# Patient Record
Sex: Male | Born: 1990 | Race: Black or African American | Hispanic: No | Marital: Single | Smoking: Never smoker
Health system: Southern US, Community
[De-identification: ages and names within clinical notes are randomized; demographics above are authoritative.]

## PROBLEM LIST (undated history)

## (undated) DIAGNOSIS — R011 Cardiac murmur, unspecified: Secondary | ICD-10-CM

## (undated) DIAGNOSIS — T7840XA Allergy, unspecified, initial encounter: Secondary | ICD-10-CM

## (undated) DIAGNOSIS — J45909 Unspecified asthma, uncomplicated: Secondary | ICD-10-CM

## (undated) HISTORY — DX: Cardiac murmur, unspecified: R01.1

## (undated) HISTORY — DX: Unspecified asthma, uncomplicated: J45.909

## (undated) HISTORY — DX: Allergy, unspecified, initial encounter: T78.40XA

## (undated) HISTORY — PX: TONSILLECTOMY: SUR1361

---
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
## 2011-06-01 ENCOUNTER — Encounter: Payer: Self-pay | Admitting: Family Medicine

## 2011-06-01 ENCOUNTER — Ambulatory Visit (INDEPENDENT_AMBULATORY_CARE_PROVIDER_SITE_OTHER): Payer: Federal, State, Local not specified - PPO | Admitting: Family Medicine

## 2011-06-01 DIAGNOSIS — J301 Allergic rhinitis due to pollen: Secondary | ICD-10-CM | POA: Insufficient documentation

## 2011-06-01 DIAGNOSIS — Q210 Ventricular septal defect: Secondary | ICD-10-CM

## 2011-06-01 DIAGNOSIS — Q254 Congenital malformation of aorta unspecified: Secondary | ICD-10-CM

## 2011-06-01 DIAGNOSIS — L509 Urticaria, unspecified: Secondary | ICD-10-CM

## 2011-06-01 DIAGNOSIS — Z0000 Encounter for general adult medical examination without abnormal findings: Secondary | ICD-10-CM

## 2011-06-01 DIAGNOSIS — R030 Elevated blood-pressure reading, without diagnosis of hypertension: Secondary | ICD-10-CM

## 2011-06-01 LAB — LIPID PANEL
Cholesterol: 202 mg/dL — ABNORMAL HIGH (ref 0–200)
HDL: 63 mg/dL (ref >39)
Total CHOL/HDL Ratio: 3.2 Ratio
Triglycerides: 42 mg/dL (ref <150)
VLDL: 8 mg/dL (ref 0–40)

## 2011-06-01 NOTE — Progress Notes (Signed)
Subjective:    Patient ID: Maurice Miller, male    DOB: 1991/08/21, 20 y.o.   MRN: [id number]  HPI He is here for complete exam. On Monday evening he developed a rash. Wednesday morning he went to an urgent care Center and was given steroid injection and a steroid taper the dose pack. He states he is feeling better. 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 He does have springtime allergies. He also has a history of VSD and has been followed by pediatric cardiology in [facility]. He will need referral to an adult cardiologist. Apparently the VSD has cause very little trouble. He did play football in high school and for a brief period in college. He has no other concerns or complaints. His social and family history were reviewed.  Review of Systems  Constitutional: Negative.   HENT: Negative.   Eyes: Negative.   Respiratory: Negative.   Cardiovascular: Negative.   Gastrointestinal: Negative.   Genitourinary: Negative.   Musculoskeletal: Negative.   Skin: Negative.   Neurological: Negative.        Objective:   Physical Exam BP 140/90  Pulse 74  Ht 5\' 11"  (1.803 m)  Wt 195 lb (88.451 kg)  BMI 27.20 kg/m2  General Appearance:    Alert, cooperative, no distress, appears stated age  Head:    Normocephalic, without obvious abnormality, atraumatic  Eyes:    PERRL, conjunctiva/corneas clear, EOM's intact, fundi    benign  Ears:    Normal TM's and external ear canals  Nose:   Nares normal, mucosa normal, no drainage or sinus   tenderness  Throat:   Lips, mucosa, and tongue normal; teeth and gums normal  Neck:   Supple, no lymphadenopathy;  thyroid:  no   enlargement/tenderness/nodules; no carotid   bruit or JVD  Back:    Spine nontender, no curvature, ROM normal, no CVA     tenderness  Lungs:     Clear to auscultation bilaterally without wheezes, rales or     ronchi; respirations unlabored  Chest Wall:    No tenderness or deformity   Heart:    Regular rate and rhythm, S1 and S2 normal, 3/6  SEM no murmur, rub   or gallop  Breast Exam:    No chest wall tenderness, masses or gynecomastia  Abdomen:     Soft, non-tender, nondistended, normoactive bowel sounds,    no masses, no hepatosplenomegaly  Genitalia:    Normal male external genitalia without lesions.  Testicles without masses.  No inguinal hernias.  Rectal:   Deferred due to age <40 and lack of symptoms  Extremities:   No clubbing, cyanosis or edema  Pulses:   2+ and symmetric all extremities  Skin:   Skin color, texture, turgor normal, slight erythema is noted with dermatographia is him   Lymph nodes:   Cervical, supraclavicular, and axillary nodes normal  Neurologic:   CNII-XII intact, normal strength, sensation and gait; reflexes 2+ and symmetric throughout          Psych:   Normal mood, affect, hygiene and grooming.           Assessment & Plan:   1. VSD (ventricular septal defect and aortic arch hypoplasia  Ambulatory referral to Cardiology  2. Routine general medical examination at a health care facility  Lipid panel  3. Allergic rhinitis due to pollen    4. Urticaria    5. Elevated blood pressure (not hypertension)     He is to return here in  2 months for recheck on his blood pressure.

## 2011-06-01 NOTE — Patient Instructions (Signed)
Continue to exercise. Cut back on salt. Come back here in 2 months for blood pressure recheck.

## 2011-06-02 ENCOUNTER — Telehealth: Payer: Self-pay

## 2011-06-02 NOTE — Telephone Encounter (Signed)
Called and left message about labs and sent diet info

## 2011-06-19 ENCOUNTER — Telehealth: Payer: Self-pay | Admitting: Family Medicine

## 2011-06-26 NOTE — Telephone Encounter (Signed)
JCL

## 2011-08-01 ENCOUNTER — Ambulatory Visit: Payer: Federal, State, Local not specified - PPO | Admitting: Family Medicine

## 2012-01-12 ENCOUNTER — Ambulatory Visit: Payer: Federal, State, Local not specified - PPO | Admitting: Family Medicine

## 2015-02-10 ENCOUNTER — Ambulatory Visit (INDEPENDENT_AMBULATORY_CARE_PROVIDER_SITE_OTHER): Payer: Federal, State, Local not specified - PPO | Admitting: Family Medicine

## 2015-02-10 ENCOUNTER — Encounter: Payer: Self-pay | Admitting: Family Medicine

## 2015-02-10 VITALS — BP 130/100 | HR 70 | Wt 203.0 lb

## 2015-02-10 DIAGNOSIS — Q254 Other congenital malformations of aorta: Secondary | ICD-10-CM

## 2015-02-10 DIAGNOSIS — Q210 Ventricular septal defect: Secondary | ICD-10-CM

## 2015-02-10 DIAGNOSIS — Q2542 Hypoplasia of aorta: Principal | ICD-10-CM

## 2015-02-10 NOTE — Progress Notes (Signed)
   Subjective:    Patient ID: Maurice KeensKevin L Fetch Miller, male    DOB: 1991-06-29, 24 y.o.   MRN: [id number]  HPI He is here for consultation. He has a history of VSD and in the past and been [facility] [place] [facility] [place]. He has outgrown the pediatric cardiology program. He has not been seen in approximately 5 years. He has had no chest pain, shortness of breath. He has had some issues with palpitations started roughly a month ago. It lasts just for for 5 seconds. The occur several times per week. He also states he has some shortness of breath with this but he has not actually measured his heart rate.   Review of Systems     Objective:   Physical Exam Alert and in no distress. 4/6 murmur is noted in the left chest area. Lungs are clear to auscultation. EKG shows no acute changes.       Assessment & Plan:  VSD (ventricular septal defect and aortic arch hypoplasia - Plan: Ambulatory referral to Cardiology Recommend he keep track of his pulse rate does have these palpitations to see if there are any rhythm changes. Explained that the palpitations don't necessarily sound like they're related to the  VSD

## 2015-02-17 ENCOUNTER — Encounter: Payer: Self-pay | Admitting: Family Medicine

## 2015-03-31 ENCOUNTER — Ambulatory Visit (INDEPENDENT_AMBULATORY_CARE_PROVIDER_SITE_OTHER): Payer: Federal, State, Local not specified - PPO | Admitting: Cardiovascular Disease

## 2015-03-31 ENCOUNTER — Encounter: Payer: Self-pay | Admitting: Cardiovascular Disease

## 2015-03-31 VITALS — BP 126/86 | HR 69 | Ht 70.0 in | Wt 191.5 lb

## 2015-03-31 DIAGNOSIS — Q210 Ventricular septal defect: Secondary | ICD-10-CM | POA: Diagnosis not present

## 2015-03-31 DIAGNOSIS — Q254 Other congenital malformations of aorta: Secondary | ICD-10-CM | POA: Diagnosis not present

## 2015-03-31 DIAGNOSIS — Q2542 Hypoplasia of aorta: Principal | ICD-10-CM

## 2015-03-31 NOTE — Patient Instructions (Signed)
Your physician has requested that you have an echocardiogram with bubble study. Echocardiography is a painless test that uses sound waves to create images of your heart. It provides your doctor with information about the size and shape of your heart and how well your heart's chambers and valves are working. This procedure takes approximately one hour. There are no restrictions for this procedure.  Dr Allyson SabalBerry recommends that you schedule a follow-up appointment in 2 years. You will receive a reminder letter in the mail two months in advance. If you don't receive a letter, please call our office to schedule the follow-up appointment.

## 2015-03-31 NOTE — Assessment & Plan Note (Signed)
Maurice Miller is a 24 year old healthy muscular appearing single African-American male with a history of VSD diagnosed at birth. He's been followed on an every other year basis by a pediatric cardiologist. He was told that this was near the aortic valve making membranous and small. He has had imaging studies in the past. He is totally asymptomatic. I'm going to get a 2-D echo with bubble study I will see back in 2 years

## 2015-03-31 NOTE — Progress Notes (Signed)
     03/31/2015 Maurice FullerKevin L Sterling Miller   1991/05/05  [id number]  Primary Physician Carollee HerterLALONDE,JOHN CHARLES, MD Primary Cardiologist: Runell GessJonathan J. Markelle Asaro MD Roseanne RenoFACP,FACC,FAHA, FSCAI   HPI:  Maurice Miller is a delightful 24 year old fit and muscular appearing single African-American male who is a senior at [employer] . He is prelaw. He is accompanied by his mother today. He was referred by his primary care physician, Dr. Sharlot GowdaJohn LaLonde, for cardiovascular evaluation and to be established in the adult cardiology practice. 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 By description it sounds membranous and small. The aortic valve. He has been followed by 2-D echocardiogram to the past. He is completely a symptomatic. He did complain of some palpitations several months ago but these are infrequent since her last only seconds at a time. He specifically denies chest pain or shortness of breath.   No current outpatient prescriptions on file.   No current facility-administered medications for this visit.    Allergies  Allergen Reactions  . Biaxin [Clarithromycin] Hives    History   Social History  . Marital Status: Single    Spouse Name: N/A  . Number of Children: N/A  . Years of Education: N/A   Occupational History  . Not on file.   Social History Main Topics  . Smoking status: Never Smoker   . Smokeless tobacco: Never Used  . Alcohol Use: No  . Drug Use: No  . Sexual Activity: Yes    Birth Control/ Protection: Condom   Other Topics Concern  . Not on file   Social History Narrative     Review of Systems: General: negative for chills, fever, night sweats or weight changes.  Cardiovascular: negative for chest pain, dyspnea on exertion, edema, orthopnea, palpitations, paroxysmal nocturnal dyspnea or shortness of breath Dermatological: negative for rash Respiratory: negative for cough or wheezing Urologic: negative for hematuria Abdominal: negative for nausea,  vomiting, diarrhea, bright red blood per rectum, melena, or hematemesis Neurologic: negative for visual changes, syncope, or dizziness All other systems reviewed and are otherwise negative except as noted above.    Blood pressure 126/86, pulse 69, height 5\' 10"  (1.778 m), weight 191 lb 8 oz (86.864 kg).  General appearance: alert and no distress Neck: no adenopathy, no carotid bruit, no JVD, supple, symmetrical, trachea midline and thyroid not enlarged, symmetric, no tenderness/mass/nodules Lungs: clear to auscultation bilaterally Heart: loud continuous murmur at the left lower sternal border Extremities: extremities normal, atraumatic, no cyanosis or edema  EKG normal sinus rhythm at 69 without ST or T-wave changes. 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 He's been followed on an every other year basis by a pediatric cardiologist. He was told that this was near the aortic valve making membranous and small. He has had imaging studies in the past. He is totally asymptomatic. I'm going to get a 2-D echo with bubble study I will see back in 2 years      Runell GessJonathan J. Lonni Dirden MD [facility] CampusFACP,FACC,FAHA, [facility] 03/31/2015 1:59 PM

## 2015-04-05 ENCOUNTER — Ambulatory Visit (HOSPITAL_COMMUNITY)
Admission: RE | Admit: 2015-04-05 | Discharge: 2015-04-05 | Disposition: A | Discharge status: Home / Self Care | Payer: Federal, State, Local not specified - PPO | Source: Ambulatory Visit | Attending: Cardiovascular Disease | Admitting: Cardiovascular Disease

## 2015-04-05 DIAGNOSIS — I340 Nonrheumatic mitral (valve) insufficiency: Secondary | ICD-10-CM | POA: Insufficient documentation

## 2015-04-05 DIAGNOSIS — Q210 Ventricular septal defect: Secondary | ICD-10-CM | POA: Diagnosis present

## 2015-04-05 DIAGNOSIS — Q254 Other congenital malformations of aorta: Secondary | ICD-10-CM | POA: Diagnosis not present

## 2015-04-05 DIAGNOSIS — I071 Rheumatic tricuspid insufficiency: Secondary | ICD-10-CM | POA: Diagnosis not present

## 2015-04-05 DIAGNOSIS — Q2542 Hypoplasia of aorta: Secondary | ICD-10-CM

## 2015-10-07 ENCOUNTER — Encounter: Payer: Self-pay | Admitting: Medical

## 2015-10-07 ENCOUNTER — Ambulatory Visit (INDEPENDENT_AMBULATORY_CARE_PROVIDER_SITE_OTHER): Payer: Federal, State, Local not specified - PPO | Admitting: Medical

## 2015-10-07 ENCOUNTER — Ambulatory Visit
Admission: RE | Admit: 2015-10-07 | Discharge: 2015-10-07 | Disposition: A | Discharge status: Home / Self Care | Payer: Federal, State, Local not specified - PPO | Source: Ambulatory Visit | Attending: Medical | Admitting: Medical

## 2015-10-07 VITALS — BP 104/80 | HR 73 | Wt 198.0 lb

## 2015-10-07 DIAGNOSIS — N509 Disorder of male genital organs, unspecified: Secondary | ICD-10-CM

## 2015-10-07 DIAGNOSIS — N5089 Other specified disorders of the male genital organs: Secondary | ICD-10-CM

## 2015-10-07 NOTE — Progress Notes (Signed)
Subjective: Chief Complaint  Patient presents with  . lower abdominal pain    felt nodule on one testicle and it was extremely tender. does workout daily but can not recall hitting area or pulling anything in that area   Here for lower abdominal pina.  2 days ago noticed tender area in testicle, felt unusual in left lower abdomen.  Then next day felt a little swollen on side of left testicle and felt swollen in left lower abdomen.   Denies fever, no NVD.   No penile discharge, no dysuria no urinary frequency, no urinary urgency, no odor in urine, no blood in urine.   Has some back pain but not related, he attributes this to labor on the job and exercise.   Sexually active, doesn't use condoms, been with same partner x 4 years.   Has had STD screen prior 16mo ago, negative, done at [employer].  No prior similar.  No other aggravating or relieving factors. No other complaint.  Past Medical History  Diagnosis Date  . Allergy   . Heart murmur     history of small, asymptomatic membranous VSD   Family History  Problem Relation Age of Onset  . Hypertension Father   . Asthma Sister   . Alzheimer's disease Maternal Grandmother   . Cancer Maternal Grandfather     Prostate cancer  . Hypertension Paternal Grandmother     ROS as in subjective   Objective: BP 104/80 mmHg  Pulse 73  Wt 198 lb (89.812 kg)  Gen: wd, wn, nad Skin: no erythema or bruising Left testicle dense feeling with asymmetry, significantly larger than the normal soften appearing right testicle, no other mass, no spermatic cord tenderness or swelling, no hernia, no lymphadenopathy Abdomen: +bs, soft, non tender, no mass, on organomegaly   Assessment: Encounter Diagnoses  Name Primary?  . Testicular mass Yes  . Scrotal swelling     Plan: Discussed case with supervising physician Dr. Susann GivensLalonde who also examined him.  Referral for scrotal ultrasound and likely referral to Urology.

## 2015-10-08 ENCOUNTER — Telehealth: Payer: Self-pay

## 2015-10-08 DIAGNOSIS — N5089 Other specified disorders of the male genital organs: Secondary | ICD-10-CM

## 2015-10-08 NOTE — Telephone Encounter (Signed)
Referred to [facility]

## 2015-10-14 ENCOUNTER — Telehealth: Payer: Self-pay | Admitting: Family Medicine

## 2015-10-14 NOTE — Telephone Encounter (Signed)
pts mom is aware and will get [place] a copy of the second opinion as well

## 2015-10-14 NOTE — Telephone Encounter (Signed)
Pt's mom, Velna Hatchet, called for pt to request a referral to urologist, Dr Seymour Bars with [facility], in [place] near their home for a 2nd opinion. Pt has been seen by one urologist. 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 Call Velna Hatchet back @ [phone] since it will be difficult to reach pt.

## 2015-10-14 NOTE — Telephone Encounter (Signed)
Faxed all forms to the office for second opinion. [facility] appt was yesterday at [facility] and the doctor has not signed off on those orders yet. LMTCB on pts mothers voicemail

## 2015-10-14 NOTE — Telephone Encounter (Signed)
What did urology here recommend?  I haven't gotten the note?    Ok for referral.

## 2015-10-18 ENCOUNTER — Telehealth: Payer: Self-pay | Admitting: Medical

## 2015-10-18 NOTE — Telephone Encounter (Signed)
That is fine, so write note

## 2015-10-18 NOTE — Telephone Encounter (Signed)
Pt said he was not able to go to work on 1/15 & 1/16 due to the scrotum mass pain that he was seen for on 1/12 and his job is requesting a doctor note to approve him being out of work.

## 2015-10-19 NOTE — Telephone Encounter (Signed)
Called pt to advise that note is ready for pick up.  

## 2015-10-26 ENCOUNTER — Ambulatory Visit (INDEPENDENT_AMBULATORY_CARE_PROVIDER_SITE_OTHER): Payer: Federal, State, Local not specified - PPO | Admitting: Family Medicine

## 2015-10-26 VITALS — BP 112/80 | HR 78 | Temp 98.4°F | Resp 18 | Ht 70.0 in | Wt 202.6 lb

## 2015-10-26 DIAGNOSIS — J069 Acute upper respiratory infection, unspecified: Secondary | ICD-10-CM | POA: Diagnosis not present

## 2015-10-26 DIAGNOSIS — R52 Pain, unspecified: Secondary | ICD-10-CM | POA: Diagnosis not present

## 2015-10-26 DIAGNOSIS — J4521 Mild intermittent asthma with (acute) exacerbation: Secondary | ICD-10-CM

## 2015-10-26 MED ORDER — ALBUTEROL SULFATE HFA 108 (90 BASE) MCG/ACT IN AERS: 1.0000 | INHALATION_SPRAY | RESPIRATORY_TRACT | Status: AC | PRN

## 2015-10-26 MED ORDER — ALBUTEROL SULFATE (2.5 MG/3ML) 0.083% IN NEBU
2.5000 mg | INHALATION_SOLUTION | Freq: Once | RESPIRATORY_TRACT | Status: AC
  Administered 2015-10-26: 2.5 mg via RESPIRATORY_TRACT

## 2015-10-26 NOTE — Progress Notes (Signed)
Subjective:  By signing my name below, I, [name], attest that this documentation has been prepared under the direction and in the presence of Meredith Staggers, MD.  [name], Medical Scribe. 10/26/2015.  4:33 PM.  I personally performed the services described in this documentation, which was scribed in my presence. The recorded information has been reviewed and considered, and addended by me as needed.    Patient ID: Maurice Miller, male    DOB: March 26, 1991, 24 y.o.   MRN: [id number]  Chief Complaint  Patient presents with  . Shortness of Breath    x last night  . Wheezing    x last night  . Generalized Body Aches    x sunday  . Cough    x sunday  . Sore Throat    x sunday  . some congestion    x sunday    HPI HPI Comments: Maurice Miller is a 25 y.o. male who presents to [facility] complaining of flu-like ymptoms, gradual onset 2 days ago.  Pt reports that his symptoms started with fatigue and myalgia after he woke up in the morning, that then developed into shortness of breath, hot/cold symptoms, sore throat, cough, congestion, headache, rhinorrhea and wheezing. 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 Pt is not UTD with the flu vaccine. He reports that he is able to eat and drink with no difficulties. Pt has a hx of asthma, that he notes has been under control. He uses albuterol prn, however he notes that he is out of this medication. He denies measurable fever. Pt works at [employer] center where the environment is at freezing temperatures.   Pt states that he has a hx of benign heart murmur.   Pt is planning to attend [employer] of law.    Patient Active Problem List   Diagnosis Date Noted  . 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 Scrotal swelling 10/07/2015  . 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 Allergic rhinitis due to pollen 06/01/2011   Past Medical History    Diagnosis Date  . Allergy   . Heart murmur     history of small, asymptomatic membranous VSD  . Asthma    Past Surgical History  Procedure Laterality Date  . Tonsillectomy     Allergies  Allergen Reactions  . Biaxin [Clarithromycin] Hives   Prior to Admission medications   Medication Sig Start Date End Date Taking? Authorizing Provider  MELOXICAM PO Take by mouth.   Yes Historical Provider, MD  sulfamethoxazole-trimethoprim (BACTRIM DS,SEPTRA DS) 800-160 MG tablet Take 1 tablet by mouth 2 (two) times daily.   Yes Historical Provider, MD   Social History   Social History  . Marital Status: Single    Spouse Name: N/A  . Number of Children: N/A  . Years of Education: N/A   Occupational History  . Not on file.   Social History Main Topics  . Smoking status: Never Smoker   . Smokeless tobacco: Never Used  . Alcohol Use: No  . Drug Use: No  . Sexual Activity: Yes    Birth Control/ Protection: Condom   Other Topics Concern  . Not on file   Social History Narrative    Review of Systems  Constitutional: Positive for chills and fatigue. Negative for fever.  HENT: Positive for congestion, rhinorrhea and sore throat.   Respiratory: Positive for cough, shortness of breath and  wheezing.   Musculoskeletal: Positive for myalgias.  Neurological: Positive for headaches.      Objective:   Physical Exam  Constitutional: He is oriented to person, place, and time. He appears well-developed and well-nourished. No distress.  HENT:  Head: Normocephalic and atraumatic.  Right Ear: Tympanic membrane, external ear and ear canal normal.  Left Ear: Tympanic membrane, external ear and ear canal normal.  Nose: No rhinorrhea.  Mouth/Throat: Oropharynx is clear and moist and mucous membranes are normal. No oropharyngeal exudate or posterior oropharyngeal erythema.  No sinus tenderness.  Eyes: Conjunctivae and EOM are normal. Pupils are equal, round, and reactive to light.  Neck: Neck  supple.  No lymphadenopathy.   Cardiovascular: Normal rate, regular rhythm and intact distal pulses.   Murmur (Grade 2-3/6 systolic murmur. Most noted at the left sternal border.) heard. Pulmonary/Chest: Effort normal. No respiratory distress. He has wheezes (expiratory). He has no rhonchi. He has no rales.  Abdominal: Soft. There is no tenderness.  Lymphadenopathy:    He has no cervical adenopathy.  Neurological: He is alert and oriented to person, place, and time. No cranial nerve deficit.  Skin: Skin is warm and dry. No rash noted.  Psychiatric: He has a normal mood and affect. His behavior is normal.  Nursing note and vitals reviewed.   Filed Vitals:   10/26/15 1442  BP: 112/80  Pulse: 78  Temp: 98.4 F (36.9 C)  TempSrc: Oral  Resp: 18  Height:  (1.778 m)  Weight: 202 lb 9.6 oz (91.899 kg)  SpO2: 98%    Peak flow reading is 470, about 74% of predicted. Predicted is approximately 636.  Improved aeration, resolution of wheeze after albuterol 2.5 mg neb.      Assessment & Plan:   Maurice Miller is a 25 y.o. male Asthma with acute exacerbation, mild intermittent - Plan: albuterol (PROVENTIL) (2.5 MG/3ML) 0.083% nebulizer solution 2.5 mg, albuterol (PROVENTIL HFA;VENTOLIN HFA) 108 (90 Base) MCG/ACT inhaler  Acute upper respiratory infection  Body aches   Suspected viral syndrome, including possible influenza, but afebrile in office and over 48 hours of symptoms, decided against flu testing. Underlying history of intermittent, mild asthma, with secondary wheezing with current infection. Improved with single albuterol neb. Prescription for albuterol to use at home, symptomatic care, out of work note for today. RTC precautions  were discussed.  Meds ordered this encounter  Medications  . MELOXICAM PO    Sig: Take by mouth.  . sulfamethoxazole-trimethoprim (BACTRIM DS,SEPTRA DS) 800-160 MG tablet    Sig: Take 1 tablet by mouth 2 (two) times daily.  Marland Kitchen  albuterol (PROVENTIL) (2.5 MG/3ML) 0.083% nebulizer solution 2.5 mg    Sig:   . albuterol (PROVENTIL HFA;VENTOLIN HFA) 108 (90 Base) MCG/ACT inhaler    Sig: Inhale 1-2 puffs into the lungs every 4 (four) hours as needed for wheezing or shortness of breath.    Dispense:  1 Inhaler    Refill:  0   Patient Instructions  I suspect you have a virus including possible influenza.  Drink plenty of fluids, tylenol or motrin if needed for body aches or headache, albuterol as needed for hours, but if you require this sooner than 4 hours, or persistent need for this medicine in the next 2-3 days, recommended follow-up here or the emergency room. See other information below.   Asthma, Acute Bronchospasm Acute bronchospasm caused by asthma is also referred to as an asthma attack. Bronchospasm means your air passages become narrowed. The  narrowing is caused by inflammation and tightening of the muscles in the air tubes (bronchi) in your lungs. This can make it hard to breathe or cause you to wheeze and cough. CAUSES Possible triggers are:  Animal dander from the skin, hair, or feathers of animals.  Dust mites contained in house dust.  Cockroaches.  Pollen from trees or grass.  Mold.  Cigarette or tobacco smoke.  Air pollutants such as dust, household cleaners, hair sprays, aerosol sprays, paint fumes, strong chemicals, or strong odors.  Cold air or weather changes. Cold air may trigger inflammation. Winds increase molds and pollens in the air.  Strong emotions such as crying or laughing hard.  Stress.  Certain medicines such as aspirin or beta-blockers.  Sulfites in foods and drinks, such as dried fruits and wine.  Infections or inflammatory conditions, such as a flu, cold, or inflammation of the nasal membranes (rhinitis).  Gastroesophageal reflux disease (GERD). GERD is a condition where stomach acid backs up into your esophagus.  Exercise or strenuous activity. SIGNS AND SYMPTOMS    Wheezing.  Excessive coughing, particularly at night.  Chest tightness.  Shortness of breath. DIAGNOSIS  Your health care provider will ask you about your medical history and perform a physical exam. A chest X-ray or blood testing may be performed to look for other causes of your symptoms or other conditions that may have triggered your asthma attack. TREATMENT  Treatment is aimed at reducing inflammation and opening up the airways in your lungs. Most asthma attacks are treated with inhaled medicines. These include quick relief or rescue medicines (such as bronchodilators) and controller medicines (such as inhaled corticosteroids). These medicines are sometimes given through an inhaler or a nebulizer. Systemic steroid medicine taken by mouth or given through an IV tube also can be used to reduce the inflammation when an attack is moderate or severe. Antibiotic medicines are only used if a bacterial infection is present.  HOME CARE INSTRUCTIONS   Rest.  Drink plenty of liquids. This helps the mucus to remain thin and be easily coughed up. Only use caffeine in moderation and do not use alcohol until you have recovered from your illness.  Do not smoke. Avoid being exposed to secondhand smoke.  You play a critical role in keeping yourself in good health. Avoid exposure to things that cause you to wheeze or to have breathing problems.  Keep your medicines up-to-date and available. Carefully follow your health care provider's treatment plan.  Take your medicine exactly as prescribed.  When pollen or pollution is bad, keep windows closed and use an air conditioner or go to places with air conditioning.  Asthma requires careful medical care. See your health care provider for a follow-up as advised. If you are more than [redacted] weeks pregnant and you were prescribed any new medicines, let your obstetrician know about the visit and how you are doing. Follow up with your health care provider as  directed.  After you have recovered from your asthma attack, make an appointment with your outpatient doctor to talk about ways to reduce the likelihood of future attacks. If you do not have a doctor who manages your asthma, make an appointment with a primary care doctor to discuss your asthma. SEEK IMMEDIATE MEDICAL CARE IF:   You are getting worse.  You have trouble breathing. If severe, call your local emergency services (911 in the [place]  You develop chest pain or discomfort.  You are vomiting.  You are not able to  keep fluids down.  You are coughing up yellow, green, brown, or bloody sputum.  You have a fever and your symptoms suddenly get worse.  You have trouble swallowing. MAKE SURE YOU:   Understand these instructions.  Will watch your condition.  Will get help right away if you are not doing well or get worse.   This information is not intended to replace advice given to you by your health care provider. Make sure you discuss any questions you have with your health care provider.   Document Released: 12/27/2006 Document Revised: 09/16/2013 Document Reviewed: 03/19/2013 Elsevier Interactive Patient Education 2016 [employer]   Upper Respiratory Infection, Adult Most upper respiratory infections (URIs) are a viral infection of the air passages leading to the lungs. A URI affects the nose, throat, and upper air passages. The most common type of URI is nasopharyngitis and is typically referred to as "the common cold." URIs run their course and usually go away on their own. Most of the time, a URI does not require medical attention, but sometimes a bacterial infection in the upper airways can follow a viral infection. This is called a secondary infection. Sinus and middle ear infections are common types of secondary upper respiratory infections. Bacterial pneumonia can also complicate a URI. A URI can worsen asthma and chronic obstructive pulmonary disease (COPD).  Sometimes, these complications can require emergency medical care and may be life threatening.  CAUSES Almost all URIs are caused by viruses. A virus is a type of germ and can spread from one person to another.  RISKS FACTORS You may be at risk for a URI if:   You smoke.   You have chronic heart or lung disease.  You have a weakened defense (immune) system.   You are very young or very old.   You have nasal allergies or asthma.  You work in crowded or poorly ventilated areas.  You work in health care facilities or schools. SIGNS AND SYMPTOMS  Symptoms typically develop 2-3 days after you come in contact with a cold virus. Most viral URIs last 7-10 days. However, viral URIs from the influenza virus (flu virus) can last 14-18 days and are typically more severe. Symptoms may include:   Runny or stuffy (congested) nose.   Sneezing.   Cough.   Sore throat.   Headache.   Fatigue.   Fever.   Loss of appetite.   Pain in your forehead, behind your eyes, and over your cheekbones (sinus pain).  Muscle aches.  DIAGNOSIS  Your health care provider may diagnose a URI by:  Physical exam.  Tests to check that your symptoms are not due to another condition such as:  Strep throat.  Sinusitis.  Pneumonia.  Asthma. TREATMENT  A URI goes away on its own with time. It cannot be cured with medicines, but medicines may be prescribed or recommended to relieve symptoms. Medicines may help:  Reduce your fever.  Reduce your cough.  Relieve nasal congestion. HOME CARE INSTRUCTIONS   Take medicines only as directed by your health care provider.   Gargle warm saltwater or take cough drops to comfort your throat as directed by your health care provider.  Use a warm mist humidifier or inhale steam from a shower to increase air moisture. This may make it easier to breathe.  Drink enough fluid to keep your urine clear or pale yellow.   Eat soups and other clear  broths and maintain good nutrition.   Rest as needed.  Return to work when your temperature has returned to normal or as your health care provider advises. You may need to stay home longer to avoid infecting others. You can also use a face mask and careful hand washing to prevent spread of the virus.  Increase the usage of your inhaler if you have asthma.   Do not use any tobacco products, including cigarettes, chewing tobacco, or electronic cigarettes. If you need help quitting, ask your health care provider. PREVENTION  The best way to protect yourself from getting a cold is to practice good hygiene.   Avoid oral or hand contact with people with cold symptoms.   Wash your hands often if contact occurs.  There is no clear evidence that vitamin C, vitamin E, echinacea, or exercise reduces the chance of developing a cold. However, it is always recommended to get plenty of rest, exercise, and practice good nutrition.  SEEK MEDICAL CARE IF:   You are getting worse rather than better.   Your symptoms are not controlled by medicine.   You have chills.  You have worsening shortness of breath.  You have brown or red mucus.  You have yellow or brown nasal discharge.  You have pain in your face, especially when you bend forward.  You have a fever.  You have swollen neck glands.  You have pain while swallowing.  You have white areas in the back of your throat. SEEK IMMEDIATE MEDICAL CARE IF:   You have severe or persistent:  Headache.  Ear pain.  Sinus pain.  Chest pain.  You have chronic lung disease and any of the following:  Wheezing.  Prolonged cough.  Coughing up blood.  A change in your usual mucus.  You have a stiff neck.  You have changes in your:  Vision.  Hearing.  Thinking.  Mood. MAKE SURE YOU:   Understand these instructions.  Will watch your condition.  Will get help right away if you are not doing well or get worse.   This  information is not intended to replace advice given to you by your health care provider. Make sure you discuss any questions you have with your health care provider.   Document Released: 03/07/2001 Document Revised: 01/26/2015 Document Reviewed: 12/17/2013 Elsevier Interactive Patient Education [employer]       I personally performed the services described in this documentation, which was scribed in my presence. The recorded information has been reviewed and considered, and addended by me as needed.

## 2015-10-26 NOTE — Patient Instructions (Signed)
I suspect you have a virus including possible influenza.  Drink plenty of fluids, tylenol or motrin if needed for body aches or headache, albuterol as needed for hours, but if you require this sooner than 4 hours, or persistent need for this medicine in the next 2-3 days, recommended follow-up here or the emergency room. See other information below.   Asthma, Acute Bronchospasm Acute bronchospasm caused by asthma is also referred to as an asthma attack. Bronchospasm means your air passages become narrowed. The narrowing is caused by inflammation and tightening of the muscles in the air tubes (bronchi) in your lungs. This can make it hard to breathe or cause you to wheeze and cough. CAUSES Possible triggers are:  Animal dander from the skin, hair, or feathers of animals.  Dust mites contained in house dust.  Cockroaches.  Pollen from trees or grass.  Mold.  Cigarette or tobacco smoke.  Air pollutants such as dust, household cleaners, hair sprays, aerosol sprays, paint fumes, strong chemicals, or strong odors.  Cold air or weather changes. Cold air may trigger inflammation. Winds increase molds and pollens in the air.  Strong emotions such as crying or laughing hard.  Stress.  Certain medicines such as aspirin or beta-blockers.  Sulfites in foods and drinks, such as dried fruits and wine.  Infections or inflammatory conditions, such as a flu, cold, or inflammation of the nasal membranes (rhinitis).  Gastroesophageal reflux disease (GERD). GERD is a condition where stomach acid backs up into your esophagus.  Exercise or strenuous activity. SIGNS AND SYMPTOMS   Wheezing.  Excessive coughing, particularly at night.  Chest tightness.  Shortness of breath. DIAGNOSIS  Your health care provider will ask you about your medical history and perform a physical exam. A chest X-ray or blood testing may be performed to look for other causes of your symptoms or other conditions that may  have triggered your asthma attack. TREATMENT  Treatment is aimed at reducing inflammation and opening up the airways in your lungs. Most asthma attacks are treated with inhaled medicines. These include quick relief or rescue medicines (such as bronchodilators) and controller medicines (such as inhaled corticosteroids). These medicines are sometimes given through an inhaler or a nebulizer. Systemic steroid medicine taken by mouth or given through an IV tube also can be used to reduce the inflammation when an attack is moderate or severe. Antibiotic medicines are only used if a bacterial infection is present.  HOME CARE INSTRUCTIONS   Rest.  Drink plenty of liquids. This helps the mucus to remain thin and be easily coughed up. Only use caffeine in moderation and do not use alcohol until you have recovered from your illness.  Do not smoke. Avoid being exposed to secondhand smoke.  You play a critical role in keeping yourself in good health. Avoid exposure to things that cause you to wheeze or to have breathing problems.  Keep your medicines up-to-date and available. Carefully follow your health care provider's treatment plan.  Take your medicine exactly as prescribed.  When pollen or pollution is bad, keep windows closed and use an air conditioner or go to places with air conditioning.  Asthma requires careful medical care. See your health care provider for a follow-up as advised. If you are more than [redacted] weeks pregnant and you were prescribed any new medicines, let your obstetrician know about the visit and how you are doing. Follow up with your health care provider as directed.  After you have recovered from your asthma attack,  make an appointment with your outpatient doctor to talk about ways to reduce the likelihood of future attacks. If you do not have a doctor who manages your asthma, make an appointment with a primary care doctor to discuss your asthma. SEEK IMMEDIATE MEDICAL CARE IF:    You are getting worse.  You have trouble breathing. If severe, call your local emergency services (911 in the [place]  You develop chest pain or discomfort.  You are vomiting.  You are not able to keep fluids down.  You are coughing up yellow, green, brown, or bloody sputum.  You have a fever and your symptoms suddenly get worse.  You have trouble swallowing. MAKE SURE YOU:   Understand these instructions.  Will watch your condition.  Will get help right away if you are not doing well or get worse.   This information is not intended to replace advice given to you by your health care provider. Make sure you discuss any questions you have with your health care provider.   Document Released: 12/27/2006 Document Revised: 09/16/2013 Document Reviewed: 03/19/2013 Elsevier Interactive Patient Education 2016 [employer]   Upper Respiratory Infection, Adult Most upper respiratory infections (URIs) are a viral infection of the air passages leading to the lungs. A URI affects the nose, throat, and upper air passages. The most common type of URI is nasopharyngitis and is typically referred to as "the common cold." URIs run their course and usually go away on their own. Most of the time, a URI does not require medical attention, but sometimes a bacterial infection in the upper airways can follow a viral infection. This is called a secondary infection. Sinus and middle ear infections are common types of secondary upper respiratory infections. Bacterial pneumonia can also complicate a URI. A URI can worsen asthma and chronic obstructive pulmonary disease (COPD). Sometimes, these complications can require emergency medical care and may be life threatening.  CAUSES Almost all URIs are caused by viruses. A virus is a type of germ and can spread from one person to another.  RISKS FACTORS You may be at risk for a URI if:   You smoke.   You have chronic heart or lung disease.  You have a  weakened defense (immune) system.   You are very young or very old.   You have nasal allergies or asthma.  You work in crowded or poorly ventilated areas.  You work in health care facilities or schools. SIGNS AND SYMPTOMS  Symptoms typically develop 2-3 days after you come in contact with a cold virus. Most viral URIs last 7-10 days. However, viral URIs from the influenza virus (flu virus) can last 14-18 days and are typically more severe. Symptoms may include:   Runny or stuffy (congested) nose.   Sneezing.   Cough.   Sore throat.   Headache.   Fatigue.   Fever.   Loss of appetite.   Pain in your forehead, behind your eyes, and over your cheekbones (sinus pain).  Muscle aches.  DIAGNOSIS  Your health care provider may diagnose a URI by:  Physical exam.  Tests to check that your symptoms are not due to another condition such as:  Strep throat.  Sinusitis.  Pneumonia.  Asthma. TREATMENT  A URI goes away on its own with time. It cannot be cured with medicines, but medicines may be prescribed or recommended to relieve symptoms. Medicines may help:  Reduce your fever.  Reduce your cough.  Relieve nasal congestion. HOME CARE INSTRUCTIONS  Take medicines only as directed by your health care provider.   Gargle warm saltwater or take cough drops to comfort your throat as directed by your health care provider.  Use a warm mist humidifier or inhale steam from a shower to increase air moisture. This may make it easier to breathe.  Drink enough fluid to keep your urine clear or pale yellow.   Eat soups and other clear broths and maintain good nutrition.   Rest as needed.   Return to work when your temperature has returned to normal or as your health care provider advises. You may need to stay home longer to avoid infecting others. You can also use a face mask and careful hand washing to prevent spread of the virus.  Increase the usage of your  inhaler if you have asthma.   Do not use any tobacco products, including cigarettes, chewing tobacco, or electronic cigarettes. If you need help quitting, ask your health care provider. PREVENTION  The best way to protect yourself from getting a cold is to practice good hygiene.   Avoid oral or hand contact with people with cold symptoms.   Wash your hands often if contact occurs.  There is no clear evidence that vitamin C, vitamin E, echinacea, or exercise reduces the chance of developing a cold. However, it is always recommended to get plenty of rest, exercise, and practice good nutrition.  SEEK MEDICAL CARE IF:   You are getting worse rather than better.   Your symptoms are not controlled by medicine.   You have chills.  You have worsening shortness of breath.  You have brown or red mucus.  You have yellow or brown nasal discharge.  You have pain in your face, especially when you bend forward.  You have a fever.  You have swollen neck glands.  You have pain while swallowing.  You have white areas in the back of your throat. SEEK IMMEDIATE MEDICAL CARE IF:   You have severe or persistent:  Headache.  Ear pain.  Sinus pain.  Chest pain.  You have chronic lung disease and any of the following:  Wheezing.  Prolonged cough.  Coughing up blood.  A change in your usual mucus.  You have a stiff neck.  You have changes in your:  Vision.  Hearing.  Thinking.  Mood. MAKE SURE YOU:   Understand these instructions.  Will watch your condition.  Will get help right away if you are not doing well or get worse.   This information is not intended to replace advice given to you by your health care provider. Make sure you discuss any questions you have with your health care provider.   Document Released: 03/07/2001 Document Revised: 01/26/2015 Document Reviewed: 12/17/2013 Elsevier Interactive Patient Education [employer]

## 2017-03-18 IMAGING — US US SCROTUM
1 series · 13 of 25 positions shown · non-contrast
Comparison: None in PACs

CLINICAL DATA: Scrotal swelling, possible mass, abdominal pain.

EXAM:
SCROTAL ULTRASOUND
DOPPLER ULTRASOUND OF THE TESTICLES
TECHNIQUE: Complete ultrasound examination of the testicles, epididymis, and
other scrotal structures was performed. Color and spectral Doppler
ultrasound were also utilized to evaluate blood flow to the
testicles.

[Series 1: us scrotum · 0.09mm/px · 13 of 56 slices shown]
[im 1/56]
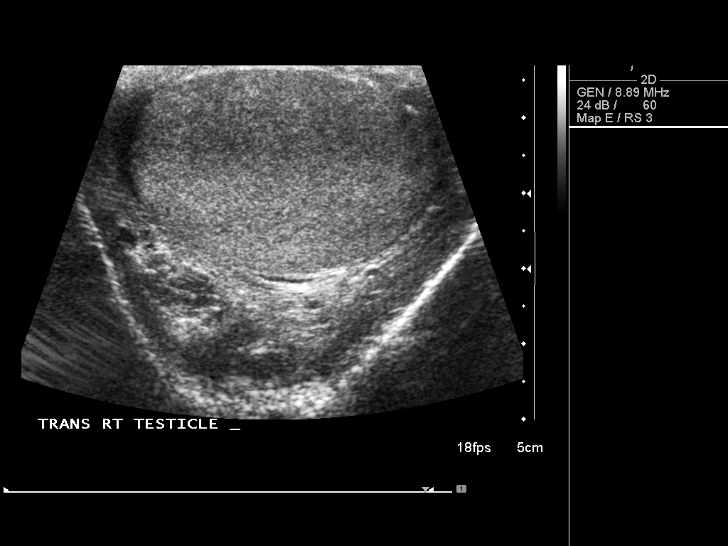
[im 5/56]
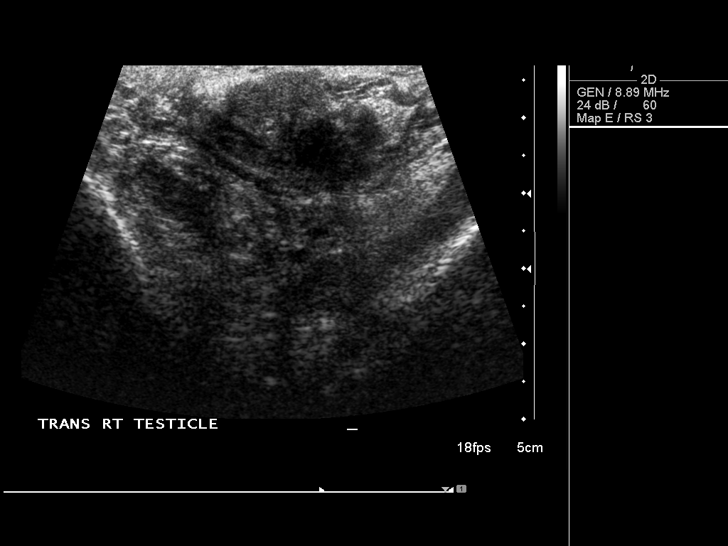
[im 10/56]
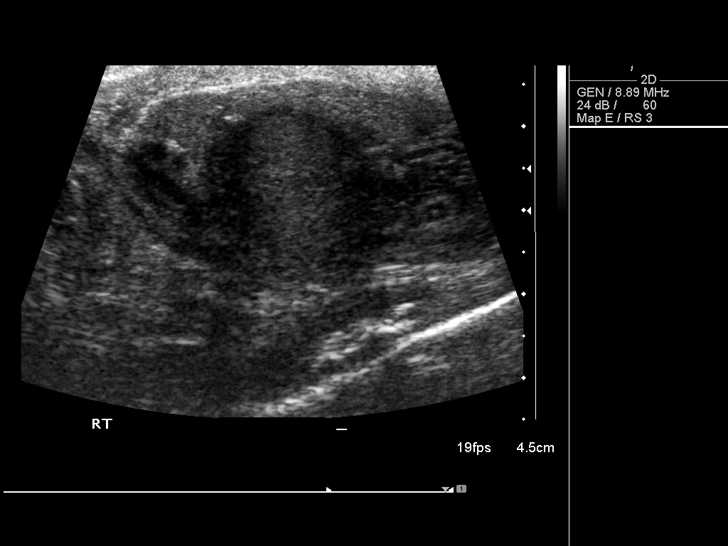
[im 14/56]
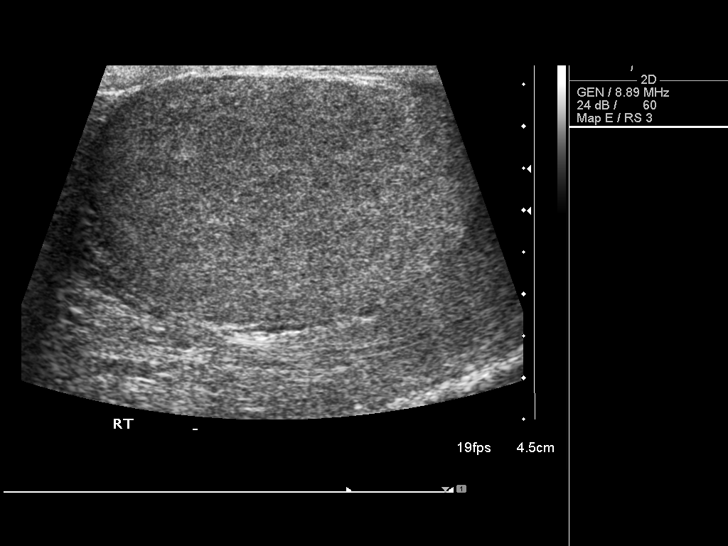
[im 19/56]
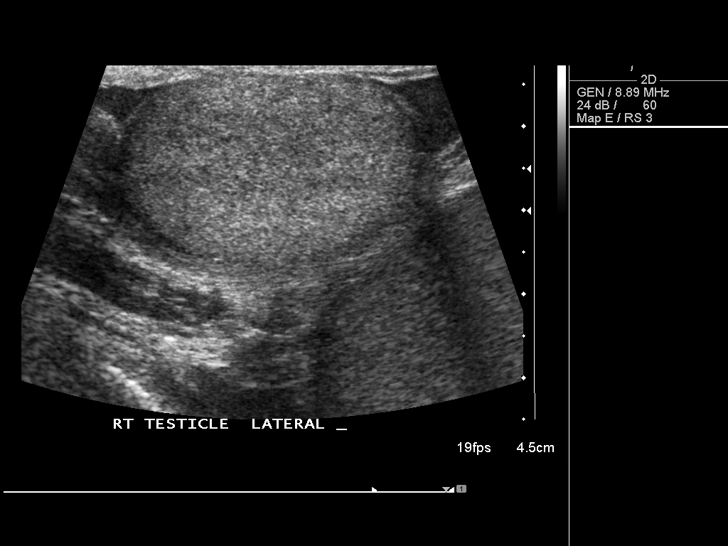
[im 23/56]
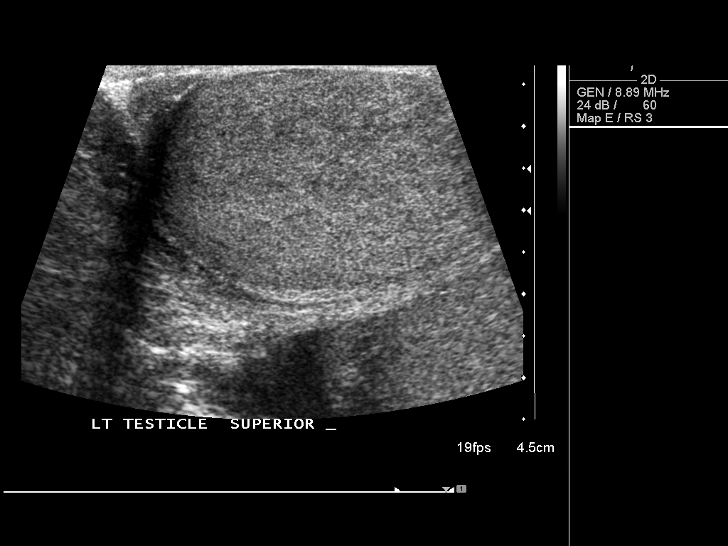
[im 28/56]
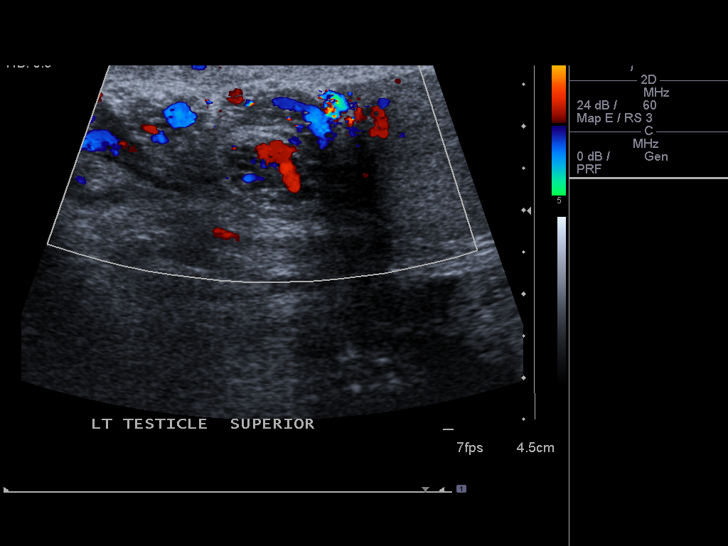
[im 33/56]
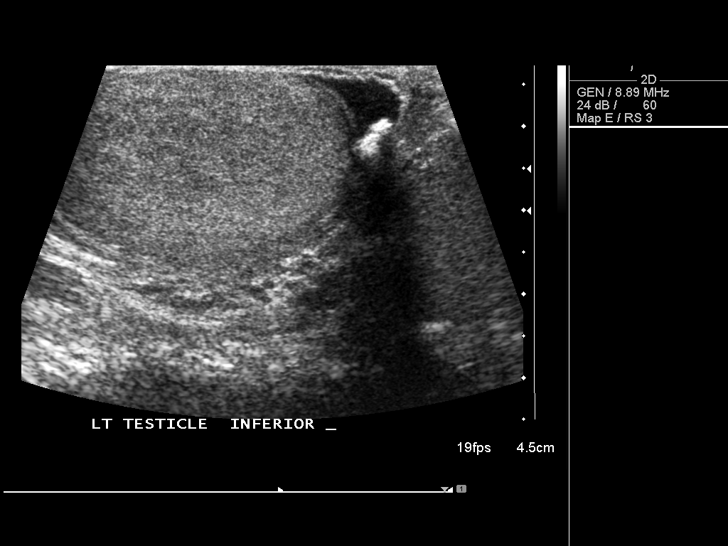
[im 37/56]
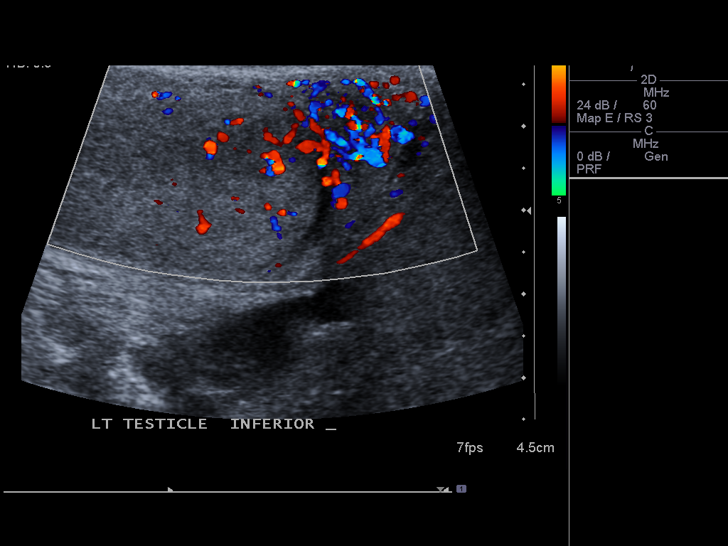
[im 42/56]
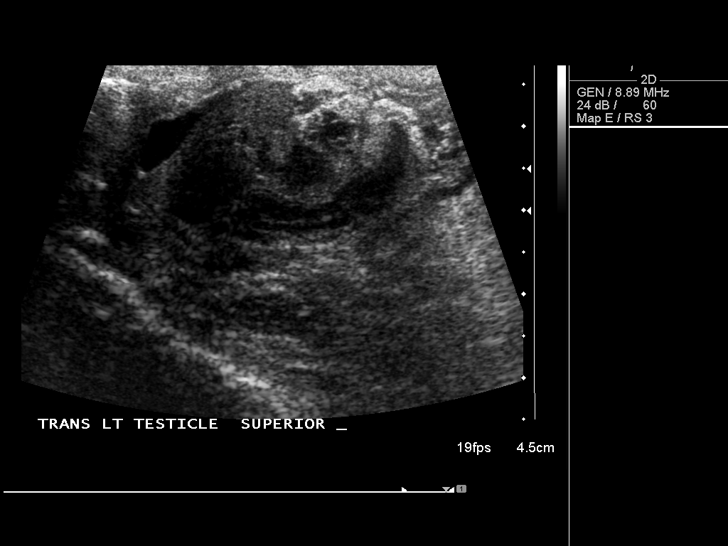
[im 46/56]
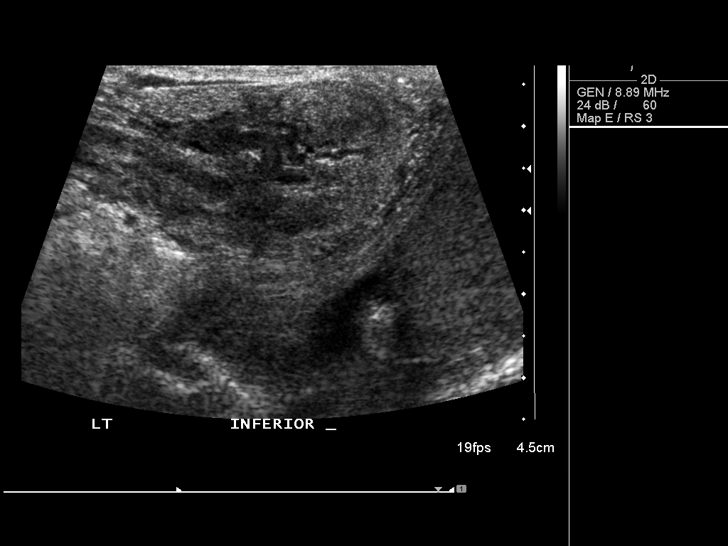
[im 51/56]
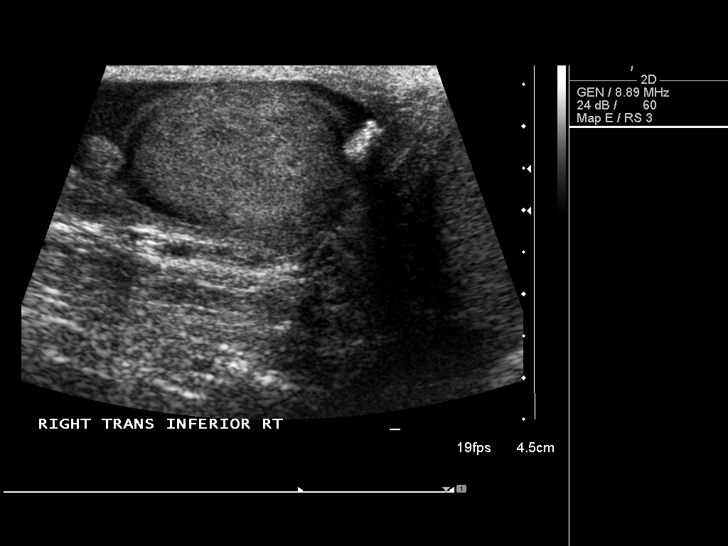
[im 56/56]
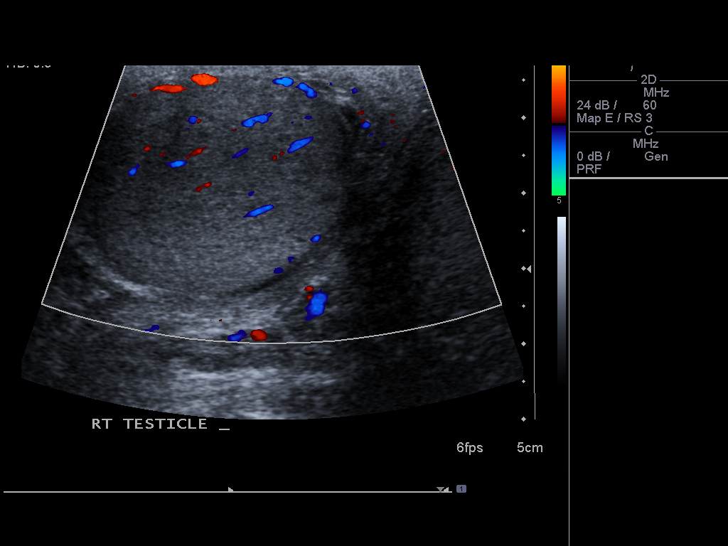

[13 of 25 positions shown; findings below may reference images not displayed]

FINDINGS: Right testicle

Measurements: 4.9 x 2.8 x 3.9 cm. The testicle demonstrates normal
echotexture. A calcified scrotal pearl lies inferior to the right
testicle.

Left testicle

Measurements: 4.6 x 2.7 x 3.7 cm. The echotexture of the left
testicle is normal. There is a 1.4 x 1 x 1.4 cm solid mass inferior
lateral to the left testicle which corresponds to the palpable
finding. It is hypervascular. It appears separate from the
epididymis.

Right epididymis:  Normal in size and appearance.

Left epididymis:  Normal in size and appearance.

Hydrocele:  There small bilateral hydroceles.

Varicocele:  There is a small varicocele on the left.

Pulsed Doppler interrogation of both testes demonstrates normal low
resistance arterial and venous waveforms bilaterally.

Incidental note is made of a left inguinal hernia containing fat and
vascularity.
IMPRESSION: 1. There is no intra testicular mass. Vascularity of the testes is
normal.
2. The epididymal structures are normal in echotexture and size.
3. There is a 1.4 cm maximal diameter solid appearing mass lying
inferior lateral to the left testicle which is separate from the
testicle and from the epididymis. It is hypervascular. This could be
inflammatory or neoplastic. Correlation with the patient's clinical
examination and laboratory values is needed. Follow-up ultrasound
would be useful if there is suspicion for a treatable inflammatory
process. Scrotal MRI could be considered if neoplasm is felt more
likely.
4. Small right-sided varicocele.  Small bilateral hydroceles.
5. Left ankle hernia containing fat and vessels.

## 2019-01-16 ENCOUNTER — Encounter: Payer: Self-pay | Admitting: Family Medicine
